# Patient Record
Sex: Male | Born: 1975 | Race: White | Hispanic: No | Marital: Married | Smoking: Never smoker
Health system: Southern US, Community
[De-identification: ages and names within clinical notes are randomized; demographics above are authoritative.]

## PROBLEM LIST (undated history)

## (undated) DIAGNOSIS — N189 Chronic kidney disease, unspecified: Secondary | ICD-10-CM

## (undated) DIAGNOSIS — S43006A Unspecified dislocation of unspecified shoulder joint, initial encounter: Secondary | ICD-10-CM

## (undated) HISTORY — DX: Chronic kidney disease, unspecified: N18.9

## (undated) HISTORY — PX: DENTAL SURGERY: SHX609

---

## 2011-01-09 ENCOUNTER — Ambulatory Visit: Payer: Self-pay | Admitting: Otolaryngology

## 2018-01-25 ENCOUNTER — Emergency Department
Admission: EM | Admit: 2018-01-25 | Discharge: 2018-01-25 | Disposition: A | Discharge status: Home / Self Care | Payer: Self-pay | Attending: Emergency Medicine | Admitting: Emergency Medicine

## 2018-01-25 ENCOUNTER — Emergency Department: Payer: Self-pay

## 2018-01-25 ENCOUNTER — Encounter: Payer: Self-pay | Admitting: Emergency Medicine

## 2018-01-25 DIAGNOSIS — R7401 Elevation of levels of liver transaminase levels: Secondary | ICD-10-CM

## 2018-01-25 DIAGNOSIS — N200 Calculus of kidney: Secondary | ICD-10-CM | POA: Insufficient documentation

## 2018-01-25 DIAGNOSIS — R740 Nonspecific elevation of levels of transaminase and lactic acid dehydrogenase [LDH]: Secondary | ICD-10-CM | POA: Insufficient documentation

## 2018-01-25 HISTORY — DX: Unspecified dislocation of unspecified shoulder joint, initial encounter: S43.006A

## 2018-01-25 LAB — COMPREHENSIVE METABOLIC PANEL
ALBUMIN: 4.6 g/dL (ref 3.5–5.0)
ALT: 45 U/L — AB (ref 0–44)
AST: 42 U/L — AB (ref 15–41)
Alkaline Phosphatase: 60 U/L (ref 38–126)
Anion gap: 12 (ref 5–15)
BUN: 19 mg/dL (ref 6–20)
CO2: 25 mmol/L (ref 22–32)
Calcium: 9.5 mg/dL (ref 8.9–10.3)
Chloride: 103 mmol/L (ref 98–111)
Creatinine, Ser: 1.31 mg/dL — ABNORMAL HIGH (ref 0.61–1.24)
GFR calc Af Amer: >60 mL/min (ref >60)
GFR calc non Af Amer: >60 mL/min (ref >60)
Glucose, Bld: 184 mg/dL — ABNORMAL HIGH (ref 70–99)
POTASSIUM: 3.6 mmol/L (ref 3.5–5.1)
Sodium: 140 mmol/L (ref 135–145)
TOTAL PROTEIN: 7.9 g/dL (ref 6.5–8.1)
Total Bilirubin: 0.9 mg/dL (ref 0.3–1.2)

## 2018-01-25 LAB — URINALYSIS, COMPLETE (UACMP) WITH MICROSCOPIC
Bacteria, UA: NONE SEEN
Bilirubin Urine: NEGATIVE
Glucose, UA: NEGATIVE mg/dL
KETONES UR: 5 mg/dL — AB
LEUKOCYTES UA: NEGATIVE
NITRITE: NEGATIVE
PH: 5 (ref 5.0–8.0)
PROTEIN: 100 mg/dL — AB
Specific Gravity, Urine: 1.035 — ABNORMAL HIGH (ref 1.005–1.030)
Squamous Epithelial / LPF: NONE SEEN (ref 0–5)

## 2018-01-25 LAB — CBC
HCT: 42.4 % (ref 40.0–52.0)
HEMOGLOBIN: 14.7 g/dL (ref 13.0–18.0)
MCH: 29.7 pg (ref 26.0–34.0)
MCHC: 34.7 g/dL (ref 32.0–36.0)
MCV: 85.7 fL (ref 80.0–100.0)
PLATELETS: 317 10*3/uL (ref 150–440)
RBC: 4.95 MIL/uL (ref 4.40–5.90)
RDW: 13.3 % (ref 11.5–14.5)
WBC: 9.8 10*3/uL (ref 3.8–10.6)

## 2018-01-25 LAB — LIPASE, BLOOD: LIPASE: 30 U/L (ref 11–51)

## 2018-01-25 MED ORDER — KETOROLAC TROMETHAMINE 30 MG/ML IJ SOLN
15.0000 mg | Freq: Once | INTRAMUSCULAR | Status: AC
  Administered 2018-01-25: 15 mg via INTRAVENOUS
  Filled 2018-01-25: qty 1

## 2018-01-25 MED ORDER — SODIUM CHLORIDE 0.9 % IV BOLUS
1000.0000 mL | Freq: Once | INTRAVENOUS | Status: AC
  Administered 2018-01-25: 1000 mL via INTRAVENOUS

## 2018-01-25 MED ORDER — OXYCODONE HCL 5 MG PO TABS
5.0000 mg | ORAL_TABLET | Freq: Once | ORAL | Status: AC
  Administered 2018-01-25: 5 mg via ORAL
  Filled 2018-01-25: qty 1

## 2018-01-25 MED ORDER — TAMSULOSIN HCL 0.4 MG PO CAPS
0.4000 mg | ORAL_CAPSULE | Freq: Once | ORAL | Status: AC
  Administered 2018-01-25: 0.4 mg via ORAL
  Filled 2018-01-25: qty 1

## 2018-01-25 MED ORDER — IBUPROFEN 600 MG PO TABS: 600.0000 mg | ORAL_TABLET | Freq: Four times a day (QID) | ORAL | 0 refills | Status: DC | PRN

## 2018-01-25 MED ORDER — OXYCODONE-ACETAMINOPHEN 5-325 MG PO TABS: 1.0000 | ORAL_TABLET | ORAL | 0 refills | Status: DC | PRN

## 2018-01-25 MED ORDER — ACETAMINOPHEN 500 MG PO TABS
1000.0000 mg | ORAL_TABLET | Freq: Once | ORAL | Status: AC
  Administered 2018-01-25: 1000 mg via ORAL
  Filled 2018-01-25: qty 2

## 2018-01-25 MED ORDER — MORPHINE SULFATE (PF) 4 MG/ML IV SOLN
4.0000 mg | Freq: Once | INTRAVENOUS | Status: AC
  Administered 2018-01-25: 4 mg via INTRAVENOUS
  Filled 2018-01-25: qty 1

## 2018-01-25 MED ORDER — ONDANSETRON HCL 4 MG/2ML IJ SOLN
4.0000 mg | Freq: Once | INTRAMUSCULAR | Status: AC | PRN
  Administered 2018-01-25: 4 mg via INTRAVENOUS
  Filled 2018-01-25: qty 2

## 2018-01-25 MED ORDER — ONDANSETRON 4 MG PO TBDP: 4.0000 mg | ORAL_TABLET | Freq: Three times a day (TID) | ORAL | 0 refills | Status: DC | PRN

## 2018-01-25 MED ORDER — ONDANSETRON HCL 4 MG/2ML IJ SOLN
4.0000 mg | Freq: Once | INTRAMUSCULAR | Status: AC
  Administered 2018-01-25: 4 mg via INTRAVENOUS
  Filled 2018-01-25: qty 2

## 2018-01-25 MED ORDER — TAMSULOSIN HCL 0.4 MG PO CAPS: 0.4000 mg | ORAL_CAPSULE | Freq: Every day | ORAL | 0 refills | Status: AC

## 2018-01-25 MED ORDER — FENTANYL CITRATE (PF) 100 MCG/2ML IJ SOLN
50.0000 ug | INTRAMUSCULAR | Status: DC | PRN
  Administered 2018-01-25: 50 ug via INTRAVENOUS
  Filled 2018-01-25: qty 2

## 2018-01-25 NOTE — ED Notes (Signed)

## 2018-01-25 NOTE — ED Provider Notes (Signed)
[facility] Emergency Department Provider Note  ____________________________________________  Time seen: Approximately 7:35 PM  I have reviewed the triage vital signs and the nursing notes.   HISTORY  Chief Complaint Abdominal Pain   HPI Todd DurandGrant Shea is a 42 y.o. male with no significant past medical history who presents for evaluation of abdominal pain.  Patient reports that he has had some mild abdominal pain for the last 3 days.  The pain is located in the right lower quadrant, initially dull and mild.  An hour ago the pain became severe and sharp.  He has had 2 or 3 episodes of nonbloody nonbilious emesis.  The pain radiates to his lower back in his groin.  No dysuria or hematuria, no fever but has had chills.  No constipation or diarrhea. No prior h/o kidney stone. Patient has had normal appetite.    Past Medical History:  Diagnosis Date  . Shoulder dislocation     There are no active problems to display for this patient.   Past Surgical History:  Procedure Laterality Date  . DENTAL SURGERY      Prior to Admission medications   Medication Sig Start Date End Date Taking? Authorizing Provider  ibuprofen (ADVIL,MOTRIN) 600 MG tablet Take 1 tablet (600 mg total) by mouth every 6 (six) hours as needed. 01/25/18   Nita SickleVeronese, [place], MD  ondansetron (ZOFRAN ODT) 4 MG disintegrating tablet Take 1 tablet (4 mg total) by mouth every 8 (eight) hours as needed for nausea or vomiting. 01/25/18   Don PerkingVeronese, [place], MD  oxyCODONE-acetaminophen (PERCOCET) 5-325 MG tablet Take 1 tablet by mouth every 4 (four) hours as needed for severe pain. 01/25/18   Nita SickleVeronese, [place], MD  tamsulosin (FLOMAX) 0.4 MG CAPS capsule Take 1 capsule (0.4 mg total) by mouth daily for 7 days. 01/25/18 02/01/18  Nita SickleVeronese, [place], MD    Allergies Patient has no known allergies.  No family history on file.  Social History Social History   Tobacco Use  . Smoking status: Never  Smoker  . Smokeless tobacco: Never Used  Substance Use Topics  . Alcohol use: Never    Frequency: Never  . Drug use: Not on file    Review of Systems  Constitutional: Negative for fever. Eyes: Negative for visual changes. ENT: Negative for sore throat. Neck: No neck pain  Cardiovascular: Negative for chest pain. Respiratory: Negative for shortness of breath. Gastrointestinal: + RLQ abdominal pain, nausea, and vomiting. No diarrhea. Genitourinary: Negative for dysuria. Musculoskeletal: Negative for back pain. Skin: Negative for rash. Neurological: Negative for headaches, weakness or numbness. Psych: No SI or HI  ____________________________________________   PHYSICAL EXAM:  VITAL SIGNS: ED Triage Vitals  Enc Vitals Group     BP 01/25/18 1854 116/63     Pulse Rate 01/25/18 1854 90     Resp 01/25/18 1854 20     Temp 01/25/18 1854 98.4 F (36.9 C)     Temp Source 01/25/18 1854 Oral     SpO2 01/25/18 1854 96 %     Weight 01/25/18 1852 165 lb (74.8 kg)     Height 01/25/18 1852 5\' 4"  (1.626 m)     Head Circumference --      Peak Flow --      Pain Score 01/25/18 1852 8     Pain Loc --      Pain Edu? --      Excl. in GC? --     Constitutional: Alert and oriented, actively vomiting.  HEENT:      Head: Normocephalic and atraumatic.         Eyes: Conjunctivae are normal. Sclera is non-icteric.       Mouth/Throat: Mucous membranes are moist.       Neck: Supple with no signs of meningismus. Cardiovascular: Regular rate and rhythm. No murmurs, gallops, or rubs. 2+ symmetrical distal pulses are present in all extremities. No JVD. Respiratory: Normal respiratory effort. Lungs are clear to auscultation bilaterally. No wheezes, crackles, or rhonchi.  Gastrointestinal: Soft, tender to palpation on the RLQ, and non distended with positive bowel sounds. No rebound or guarding. Genitourinary: No CVA tenderness. Bilateral testicles are descended with no tenderness to palpation,  bilateral positive cremasteric reflexes are present, no swelling or erythema of the scrotum. No evidence of inguinal hernia. Musculoskeletal: Nontender with normal range of motion in all extremities. No edema, cyanosis, or erythema of extremities. Neurologic: Normal speech and language. Face is symmetric. Moving all extremities. No gross focal neurologic deficits are appreciated. Skin: Skin is warm, dry and intact. No rash noted. Psychiatric: Mood and affect are normal. Speech and behavior are normal.  ____________________________________________   LABS (all labs ordered are listed, but only abnormal results are displayed)  Labs Reviewed  COMPREHENSIVE METABOLIC PANEL - Abnormal; Notable for the following components:      Result Value   Glucose, Bld 184 (*)    Creatinine, Ser 1.31 (*)    AST 42 (*)    ALT 45 (*)    All other components within normal limits  URINALYSIS, COMPLETE (UACMP) WITH MICROSCOPIC - Abnormal; Notable for the following components:   Color, Urine AMBER (*)    APPearance HAZY (*)    Specific Gravity, Urine 1.035 (*)    Hgb urine dipstick LARGE (*)    Ketones, ur 5 (*)    Protein, ur 100 (*)    All other components within normal limits  LIPASE, BLOOD  CBC   ____________________________________________  EKG  none  ____________________________________________  RADIOLOGY  I have personally reviewed the images performed during this visit and I agree with the Radiologist's read.   Interpretation by Radiologist:  Ct Renal Stone Study  Result Date: 01/25/2018 CLINICAL DATA:  Initial evaluation for acute right lower quadrant and flank pain. EXAM: CT ABDOMEN AND PELVIS WITHOUT CONTRAST TECHNIQUE: Multidetector CT imaging of the abdomen and pelvis was performed following the standard protocol without IV contrast. COMPARISON:  None. FINDINGS: Lower chest: Minimal atelectatic changes present within the visualized lung bases. Visualized lungs are otherwise clear.  Hepatobiliary: Liver demonstrates a normal unenhanced appearance. Gallbladder within normal limits. No biliary dilatation. Pancreas: Pancreas within normal limits. Spleen: Unremarkable. Adrenals/Urinary Tract: Adrenal glands are normal. 3 mm obstructive stone within the distal right ureter just proximal to the right UVJ. Secondary mild right hydroureteronephrosis. No other radiopaque calculi seen along the course of the right renal collecting system or within the right kidney. On the left, punctate 3 mm nonobstructive stone within the lower pole left kidney. No obstructive calculi seen along the course of the left renal collecting system. No left-sided hydronephrosis or hydroureter. Bladder largely decompressed without abnormality. No layering stones within the bladder lumen. Stomach/Bowel: Small hiatal hernia noted. Stomach otherwise unremarkable. No evidence for bowel obstruction. Normal appendix. No acute inflammatory changes about the bowels. Vascular/Lymphatic: Intra-abdominal aorta of normal caliber. No adenopathy. Reproductive: Normal prostate. Other: No free air or fluid. Musculoskeletal: No acute osseus abnormality. No worrisome lytic or blastic osseous lesions. IMPRESSION: 1. 3 mm obstructive  stone within the distal right ureter with secondary mild right hydroureteronephrosis. 2. 3 mm nonobstructive left renal calculus. 3. No other acute abnormality within the abdomen and pelvis. Electronically Signed   By: Rise Mu M.D.   On: 01/25/2018 19:45      ____________________________________________   PROCEDURES  Procedure(s) performed: None Procedures Critical Care performed:  None ____________________________________________   INITIAL IMPRESSION / ASSESSMENT AND PLAN / ED COURSE  42 y.o. male with no significant past medical history who presents for evaluation of abdominal pain, nausea, and vomiting.  Patient is actively vomiting and looks uncomfortable, his vitals are within normal  limits, he has tenderness to palpation the right lower quadrant, normal GU exam.  Differential diagnoses including but not limited to kidney stone versus appendicitis versus gallbladder disease. UA showing hematuria but no evidence of infection. CT renal pending. Morphine and zofran pending.    _________________________ 9:12 PM on 01/25/2018 -----------------------------------------  CT consistent with a 3 mm obstructive stone in the distal right ureter with mild hydroureteronephrosis.  Patient has normal kidney function.  UA negative for overlying infection.  Pain is well controlled on p.o. medications.  Labs were also concerning for mildly elevated liver enzymes.  Patient does not take Tylenol and does not drink alcohol.  Unclear etiology.  Recommended follow-up with primary care doctor in a few weeks for recheck.   As part of my medical decision making, I reviewed the following data within the electronic MEDICAL RECORD NUMBER Nursing notes reviewed and incorporated, Labs reviewed , Old chart reviewed, Radiograph reviewed , Notes from prior ED visits and [place] Controlled Substance Database    Pertinent labs & imaging results that were available during my care of the patient were reviewed by me and considered in my medical decision making (see chart for details).    ____________________________________________   FINAL CLINICAL IMPRESSION(S) / ED DIAGNOSES  Final diagnoses:  Kidney stone  Transaminitis      NEW MEDICATIONS STARTED DURING THIS VISIT:  ED Discharge Orders        Ordered    tamsulosin (FLOMAX) 0.4 MG CAPS capsule  Daily     01/25/18 2107    ibuprofen (ADVIL,MOTRIN) 600 MG tablet  Every 6 hours PRN     01/25/18 2107    oxyCODONE-acetaminophen (PERCOCET) 5-325 MG tablet  Every 4 hours PRN     01/25/18 2107    ondansetron (ZOFRAN ODT) 4 MG disintegrating tablet  Every 8 hours PRN     01/25/18 2107       Note:  This document was prepared using Dragon voice recognition  software and may include unintentional dictation errors.    Nita Sickle, MD 01/25/18 2114

## 2018-01-25 NOTE — ED Triage Notes (Signed)
Patient reports severe right lower quadrant abdominal pain that began approx. 1 hour ago.  Patient appears pale and diaphoretic.  Patient reports tenderness to area.  Patient appears very uncomfortable.

## 2018-01-25 NOTE — Discharge Instructions (Addendum)
You have been seen in the Emergency Department (ED)  Today and was diagnosed with kidney stones. While the stone is traveling through the ureter, which is the tube that carries urine from the kidney to the bladder, you will probably feel pain. The pain may be mild or very severe. You may also have some blood in your urine. As soon as the stone reaches the bladder, any intense pain should go away. If a stone is too large to pass on its own, you may need a medical procedure to help you pass the stone.   As we have discussed, please drink plenty of fluids and use a urinary strainer to attempt to capture the stone.  Please make a follow up appointment with Urology in the next week by calling the number below and bring the stone with you.  Take ibuprofen 600mg  every 6 hours for the pain. If the pain is not well controlled with ibuprofen you may take one percocet every 4 hours. Do not take tylenol while taking percocet. Please also take your prescribed flomax daily. Check with your doctor if you have a history of gastritis, stomach ulcers, renal failure or impaired kidney function as you may not be able to take ibuprofen/ motrin. Your doctor can give you a different prescription for pain control.  Follow-up with your doctor or return to the ER in 12-24 hours if your pain is not well controlled, if you develop pain or burning with urination, or if you develop a fever. Otherwise follow up in 3-5 days with your doctor.  Also follow up with your doctor or [facility] in a few weeks to have your liver enzymes re-cehceked as they were slightly elevated today.  When should you call for help?  Call your doctor now or seek immediate medical care if:  You cannot keep down fluids.  Your pain gets worse.  You have a fever or chills.  You have new or worse pain in your back just below your rib cage (the flank area).  You have new or more blood in your urine. You have pain or burning with urination You are unable  to urinate You have abdominal pain  Watch closely for changes in your health, and be sure to contact your doctor if:  You do not get better as expected  How can you care for yourself at home?  Drink plenty of fluids, enough so that your urine is light yellow or clear like water. If you have kidney, heart, or liver disease and have to limit fluids, talk with your doctor before you increase the amount of fluids you drink.  Take pain medicines exactly as directed. Call your doctor if you think you are having a problem with your medicine.  If the doctor gave you a prescription medicine for pain, take it as prescribed.  If you are not taking a prescription pain medicine, ask your doctor if you can take an over-the-counter medicine. Read and follow all instructions on the label. Your doctor may ask you to strain your urine so that you can collect your kidney stone when it passes. You can use a kitchen strainer or a tea strainer to catch the stone. Store it in a plastic bag until you see your doctor again.  Preventing future kidney stones  Some changes in your diet may help prevent kidney stones. Depending on the cause of your stones, your doctor may recommend that you:  Drink plenty of fluids, enough so that your urine is  light yellow or clear like water. If you have kidney, heart, or liver disease and have to limit fluids, talk with your doctor before you increase the amount of fluids you drink.  Limit coffee, tea, and alcohol. Also avoid grapefruit juice.  Do not take more than the recommended daily dose of vitamins C and D.  Avoid antacids such as Gaviscon, Maalox, Mylanta, or Tums.  Limit the amount of salt (sodium) in your diet.  Eat a balanced diet that is not too high in protein.  Limit foods that are high in a substance called oxalate, which can cause kidney stones. These foods include dark green vegetables, rhubarb, chocolate, wheat bran, nuts, cranberries, and beans.

## 2018-02-03 ENCOUNTER — Ambulatory Visit (INDEPENDENT_AMBULATORY_CARE_PROVIDER_SITE_OTHER): Payer: Self-pay | Admitting: Urology

## 2018-02-03 ENCOUNTER — Encounter: Payer: Self-pay | Admitting: Urology

## 2018-02-03 VITALS — BP 123/83 | HR 80 | Ht 66.0 in | Wt 160.0 lb

## 2018-02-03 DIAGNOSIS — R945 Abnormal results of liver function studies: Secondary | ICD-10-CM

## 2018-02-03 DIAGNOSIS — N200 Calculus of kidney: Secondary | ICD-10-CM

## 2018-02-03 DIAGNOSIS — R7989 Other specified abnormal findings of blood chemistry: Secondary | ICD-10-CM

## 2018-02-03 LAB — MICROSCOPIC EXAMINATION: Epithelial Cells (non renal): NONE SEEN /hpf (ref 0–10)

## 2018-02-03 LAB — URINALYSIS, COMPLETE
Bilirubin, UA: NEGATIVE
Glucose, UA: NEGATIVE
Ketones, UA: NEGATIVE
Nitrite, UA: NEGATIVE
Specific Gravity, UA: 1.02 (ref 1.005–1.030)
Urobilinogen, Ur: 0.2 mg/dL (ref 0.2–1.0)
pH, UA: 7 (ref 5.0–7.5)

## 2018-02-03 NOTE — Progress Notes (Signed)
02/03/2018 5:35 PM   Todd DurandGrant Shea 1975/09/09 [id number]  Referring provider: No referring provider defined for this encounter.  Chief Complaint  Patient presents with  . Nephrolithiasis    New Patient    HPI: 42 year old male who presents today for further evaluation  He was seen and evaluated in the emergency room on 01/25/2018 with a 3-day history of right lower quadrant pain.  He initially thought it was something that he ate but then it became more severe sharp.  He did have associated nausea and vomiting.  No gross hematuria.  He ultimately underwent noncontrast CT scan which showed a 3 mm right distal ureteral calculus with mild right hydronephrosis.  He also has a punctate left renal calculus.  He reports that he has extremely dull right lower quadrant pain but significantly improved over the past several days.  He did pass some substance which he brings with him today.  On further inspection, this appears to be an old blood clot with calcification in the middle consistent with the size of the stone on CT scan.  He denies any urinary symptoms including gross hematuria or dysuria.  He did pass a small clot yesterday but otherwise has not been having gross blood.  No fevers or chills.  He denies a personal history of kidney stones.  He does try to drink a good amount of water but admits to being dehydrated at times.  He does also report that in the emergency room, his LFTs were mildly elevated.  He does not have a primary care physician.  He denies using alcohol or Tylenol prior to his emergency room visit.  No right upper quadrant pain.   PMH: Past Medical History:  Diagnosis Date  . Shoulder dislocation     Surgical History: Past Surgical History:  Procedure Laterality Date  . DENTAL SURGERY      Home Medications:  Allergies as of 02/03/2018   No Known Allergies     Medication List        Accurate as of 02/03/18  5:35 PM. Always use your most recent med list.            ibuprofen 600 MG tablet Commonly known as:  ADVIL,MOTRIN Take 1 tablet (600 mg total) by mouth every 6 (six) hours as needed.   ondansetron 4 MG disintegrating tablet Commonly known as:  ZOFRAN ODT Take 1 tablet (4 mg total) by mouth every 8 (eight) hours as needed for nausea or vomiting.   oxyCODONE-acetaminophen 5-325 MG tablet Commonly known as:  PERCOCET Take 1 tablet by mouth every 4 (four) hours as needed for severe pain.   tamsulosin 0.4 MG Caps capsule Commonly known as:  FLOMAX Take 0.4 mg by mouth.       Allergies: No Known Allergies  Family History: History reviewed. No pertinent family history.  Social History:  reports that he has never smoked. He has never used smokeless tobacco. He reports that he does not drink alcohol. His drug history is not on file.  ROS: UROLOGY Frequent Urination?: Yes Hard to postpone urination?: No Burning/pain with urination?: No Get up at night to urinate?: Yes Leakage of urine?: No Urine stream starts and stops?: No Trouble starting stream?: No Do you have to strain to urinate?: No Blood in urine?: No Urinary tract infection?: No Sexually transmitted disease?: No Injury to kidneys or bladder?: No Painful intercourse?: No Weak stream?: No Erection problems?: No Penile pain?: No  Gastrointestinal Nausea?: Yes Vomiting?: Yes Indigestion/heartburn?: No  Diarrhea?: No Constipation?: No  Constitutional Fever: Yes Night sweats?: No Weight loss?: No Fatigue?: Yes  Skin Skin rash/lesions?: Yes Itching?: Yes  Eyes Blurred vision?: No Double vision?: No  Ears/Nose/Throat Sore throat?: No Sinus problems?: No  Hematologic/Lymphatic Swollen glands?: No Easy bruising?: No  Cardiovascular Leg swelling?: No Chest pain?: No  Respiratory Cough?: No Shortness of breath?: No  Endocrine Excessive thirst?: No  Musculoskeletal Back pain?: Yes Joint pain?: Yes  Neurological Headaches?:  Yes Dizziness?: No  Psychologic Depression?: No Anxiety?: No  Physical Exam: BP 123/83   Pulse 80   Ht 5\' 6"  (1.676 m)   Wt 160 lb (72.6 kg)   BMI 25.82 kg/m   Constitutional:  Alert and oriented, No acute distress. HEENT: [place] AT, moist mucus membranes.  Trachea midline, no masses. Cardiovascular: No clubbing, cyanosis, or edema. Respiratory: Normal respiratory effort, no increased work of breathing. GI: Abdomen is soft, nontender, nondistended, no abdominal masses GU: No CVA tenderness Skin: No rashes, bruises or suspicious lesions. Neurologic: Grossly intact, no focal deficits, moving all 4 extremities. Psychiatric: Normal mood and affect.  Laboratory Data: Lab Results  Component Value Date   WBC 9.8 01/25/2018   HGB 14.7 01/25/2018   HCT 42.4 01/25/2018   MCV 85.7 01/25/2018   PLT 317 01/25/2018    Lab Results  Component Value Date   CREATININE 1.31 (H) 01/25/2018    Urinalysis UA today with 11-30 white blood cells, 11-30 red blood cells, nitrate negative with moderate bacteria.  See epic for details.  Pertinent Imaging: Results for orders placed during the hospital encounter of 01/25/18  CT Renal Stone Study   Narrative CLINICAL DATA:  Initial evaluation for acute right lower quadrant and flank pain.  EXAM: CT ABDOMEN AND PELVIS WITHOUT CONTRAST  TECHNIQUE: Multidetector CT imaging of the abdomen and pelvis was performed following the standard protocol without IV contrast.  COMPARISON:  None.  FINDINGS: Lower chest: Minimal atelectatic changes present within the visualized lung bases. Visualized lungs are otherwise clear.  Hepatobiliary: Liver demonstrates a normal unenhanced appearance. Gallbladder within normal limits. No biliary dilatation.  Pancreas: Pancreas within normal limits.  Spleen: Unremarkable.  Adrenals/Urinary Tract: Adrenal glands are normal.  3 mm obstructive stone within the distal right ureter just proximal to the right UVJ.  Secondary mild right hydroureteronephrosis. No other radiopaque calculi seen along the course of the right renal collecting system or within the right kidney.  On the left, punctate 3 mm nonobstructive stone within the lower pole left kidney. No obstructive calculi seen along the course of the left renal collecting system. No left-sided hydronephrosis or hydroureter.  Bladder largely decompressed without abnormality. No layering stones within the bladder lumen.  Stomach/Bowel: Small hiatal hernia noted. Stomach otherwise unremarkable. No evidence for bowel obstruction. Normal appendix. No acute inflammatory changes about the bowels.  Vascular/Lymphatic: Intra-abdominal aorta of normal caliber. No adenopathy.  Reproductive: Normal prostate.  Other: No free air or fluid.  Musculoskeletal: No acute osseus abnormality. No worrisome lytic or blastic osseous lesions.  IMPRESSION: 1. 3 mm obstructive stone within the distal right ureter with secondary mild right hydroureteronephrosis. 2. 3 mm nonobstructive left renal calculus. 3. No other acute abnormality within the abdomen and pelvis.   Electronically Signed   By: Rise Mu M.D.   On: 01/25/2018 19:45     Assessment & Plan:    1. Nephrolithiasis Status post interval passage of right 3 mm calculus  Offered to send stone for analysis today but declined due  to concern for cost  He does have a punctate stone on the left side but given the location and actually small size, no need for further surveillance unless he becomes symptomatic  We discussed general stone prevention techniques including drinking plenty water with goal of producing 2.5 L urine daily, increased citric acid intake, avoidance of high oxalate containing foods, and decreased salt intake.  Information about dietary recommendations given today.   He was  advised to call if his abdominal right lower quadrant pain fails to improve in the next few  days  Continue Flomax for few more days as precaution, may stop all other meds  - Urinalysis, Complete - CULTURE, URINE COMPREHENSIVE  2. Elevated LFTs Etiology of elevated LFTs unclear Patient does not have primary care physician and wonders if this is somehow related to his stone episode We will go ahead and repeat his LFTs today and he will establish care with a primary care physician if they remain elevated for further evaluation - Hepatic function panel  Follow-up as needed  Vanna Scotland, MD  [facility] [address], [address] [place], [place] [address] [phone]

## 2018-02-04 LAB — HEPATIC FUNCTION PANEL
ALT: 33 IU/L (ref 0–44)
AST: 16 IU/L (ref 0–40)
Albumin: 4.3 g/dL (ref 3.5–5.5)
Alkaline Phosphatase: 115 IU/L (ref 39–117)
BILIRUBIN TOTAL: 0.6 mg/dL (ref 0.0–1.2)
BILIRUBIN, DIRECT: 0.16 mg/dL (ref 0.00–0.40)
TOTAL PROTEIN: 7.4 g/dL (ref 6.0–8.5)

## 2018-02-05 LAB — CULTURE, URINE COMPREHENSIVE

## 2018-02-15 ENCOUNTER — Ambulatory Visit: Payer: Self-pay | Admitting: Family Medicine

## 2018-02-15 ENCOUNTER — Encounter: Payer: Self-pay | Admitting: Family Medicine

## 2018-02-15 ENCOUNTER — Other Ambulatory Visit: Payer: Self-pay

## 2018-02-15 VITALS — BP 110/75 | HR 77 | Temp 98.0°F | Ht 66.0 in | Wt 157.3 lb

## 2018-02-15 DIAGNOSIS — Z7689 Persons encountering health services in other specified circumstances: Secondary | ICD-10-CM

## 2018-02-15 DIAGNOSIS — L502 Urticaria due to cold and heat: Secondary | ICD-10-CM

## 2018-02-15 MED ORDER — CETIRIZINE HCL 10 MG PO TABS: 10.0000 mg | ORAL_TABLET | Freq: Two times a day (BID) | ORAL | 0 refills | Status: AC

## 2018-02-15 MED ORDER — PREDNISONE 10 MG PO TABS: ORAL_TABLET | ORAL | 0 refills | Status: AC

## 2018-02-15 NOTE — Progress Notes (Signed)
BP 110/75   Pulse 77   Temp 98 F (36.7 C) (Oral)   Ht 5\' 6"  (1.676 m)   Wt 157 lb 4.8 oz (71.4 kg)   SpO2 98%   BMI 25.39 kg/m    Subjective:    Patient ID: Todd Shea, male    DOB: 03/02/1976, 42 y.o.   MRN: [id number]  HPI: Todd Shea is a 42 y.o. male  Chief Complaint  Patient presents with  . New Patient (Initial Visit)    pt would like to talk about his kidneys, and rashes on any where in his body when it is exposed to cold   Recently hospitalized for kidney stones, completely resolved with no lingering sxs.   Main concern today is hives that started in July. Seems to be exacerbated by direct cold (cold water, air conditioner blowing on him). Not using anything OTC for it. Areas usually resolve on their own but continue to come back with exposure.   Has not had a CPE for quite some time. Currently without health insurance. No known chronic medical problems, not taking any medications.   Relevant past medical, surgical, family and social history reviewed and updated as indicated. Interim medical history since our last visit reviewed. Allergies and medications reviewed and updated.  Review of Systems  Per HPI unless specifically indicated above     Objective:    BP 110/75   Pulse 77   Temp 98 F (36.7 C) (Oral)   Ht 5\' 6"  (1.676 m)   Wt 157 lb 4.8 oz (71.4 kg)   SpO2 98%   BMI 25.39 kg/m   Wt Readings from Last 3 Encounters:  02/15/18 157 lb 4.8 oz (71.4 kg)  02/03/18 160 lb (72.6 kg)  01/25/18 165 lb (74.8 kg)    Physical Exam  Constitutional: He is oriented to person, place, and time. He appears well-developed and well-nourished. No distress.  HENT:  Head: Atraumatic.  Eyes: Conjunctivae and EOM are normal.  Neck: Normal range of motion. Neck supple.  Cardiovascular: Normal rate and regular rhythm.  Pulmonary/Chest: Effort normal and breath sounds normal.  Musculoskeletal: Normal range of motion.  Neurological: He is alert and oriented to  person, place, and time.  Skin: Skin is warm and dry.  Erythematous maculopapular rash on forearm  Psychiatric: He has a normal mood and affect. His behavior is normal.  Nursing note and vitals reviewed.   Results for orders placed or performed in visit on 02/03/18  CULTURE, URINE COMPREHENSIVE  Result Value Ref Range   Urine Culture, Comprehensive Final report    Organism ID, Bacteria Comment   Microscopic Examination  Result Value Ref Range   WBC, UA 11-30 (A) 0 - 5 /hpf   RBC, UA 11-30 (A) 0 - 2 /hpf   Epithelial Cells (non renal) None seen 0 - 10 /hpf   Casts Present (A) None seen /lpf   Cast Type Hyaline casts N/A   Mucus, UA Present (A) Not Estab.   Bacteria, UA Moderate (A) None seen/Few  Urinalysis, Complete  Result Value Ref Range   Specific Gravity, UA 1.020 1.005 - 1.030   pH, UA 7.0 5.0 - 7.5   Color, UA Yellow Yellow   Appearance Ur Cloudy (A) Clear   Leukocytes, UA 2+ (A) Negative   Protein, UA 3+ (A) Negative/Trace   Glucose, UA Negative Negative   Ketones, UA Negative Negative   RBC, UA 2+ (A) Negative   Bilirubin, UA Negative Negative  Urobilinogen, Ur 0.2 0.2 - 1.0 mg/dL   Nitrite, UA Negative Negative   Microscopic Examination See below:   Hepatic function panel  Result Value Ref Range   Total Protein 7.4 6.0 - 8.5 g/dL   Albumin 4.3 3.5 - 5.5 g/dL   Bilirubin Total 0.6 0.0 - 1.2 mg/dL   Bilirubin, Direct [id number] 0.00 - 0.40 mg/dL   Alkaline Phosphatase 115 39 - 117 IU/L   AST 16 0 - 40 IU/L   ALT 33 0 - 44 IU/L      Assessment & Plan:   Problem List Items Addressed This Visit    None    Visit Diagnoses    Urticaria due to cold    -  Primary   Will try zyrtec BID and a short course of prednisone. F/u if not improving. Cortisone cream prn for spot treatment   Encounter to establish care           Follow up plan: Return for CPE.

## 2018-02-18 NOTE — Patient Instructions (Signed)
Follow up for CPE 

## 2018-09-04 ENCOUNTER — Telehealth: Payer: Self-pay | Admitting: Physician Assistant

## 2018-09-04 DIAGNOSIS — B9689 Other specified bacterial agents as the cause of diseases classified elsewhere: Secondary | ICD-10-CM

## 2018-09-04 DIAGNOSIS — J069 Acute upper respiratory infection, unspecified: Secondary | ICD-10-CM

## 2018-09-04 MED ORDER — AZITHROMYCIN 250 MG PO TABS: ORAL_TABLET | ORAL | 0 refills | Status: AC

## 2018-09-04 NOTE — Progress Notes (Signed)

## 2018-09-04 NOTE — Progress Notes (Signed)
There are other unrelated non-urgent complaints, but due to the busy schedule and the amount of time I've already spent with him, time does not permit me to address these routine issues at today's visit. I've requested another appointment to review these additional issues.

## 2019-04-25 IMAGING — CT CT RENAL STONE PROTOCOL
3 of 4 series · 8 of 46 positions shown, 15 images · non-contrast
Comparison: None.

CLINICAL DATA: Initial evaluation for acute right lower quadrant
and flank pain.

EXAM:
CT ABDOMEN AND PELVIS WITHOUT CONTRAST
TECHNIQUE: Multidetector CT imaging of the abdomen and pelvis was performed
following the standard protocol without IV contrast.

[Series 4: lung bases · axial · 0.64mm/px · z∈[-832,-772]mm · 4 of 21 slices shown, 9 images]
[im 5/21  soft-tissue]
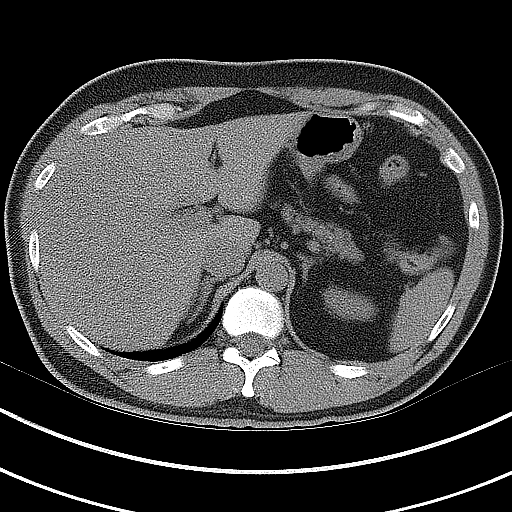
[im 5/21  lung]
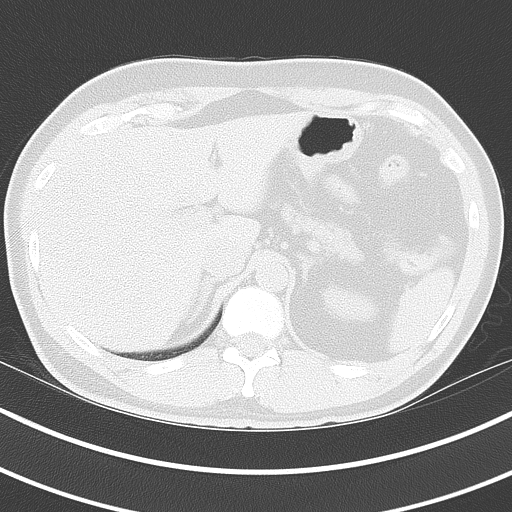
[im 5/21  bone]
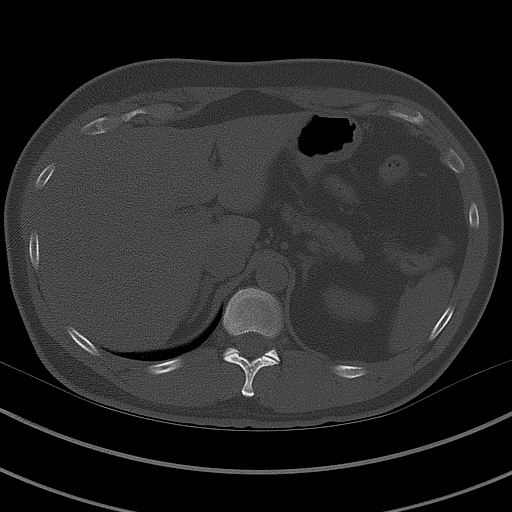
[im 9/21  soft-tissue]
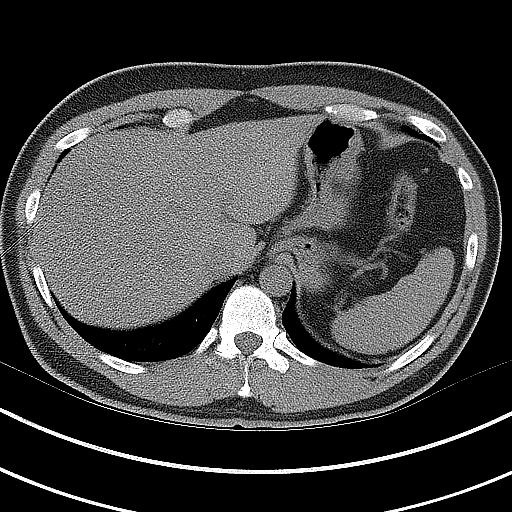
[im 9/21  lung]
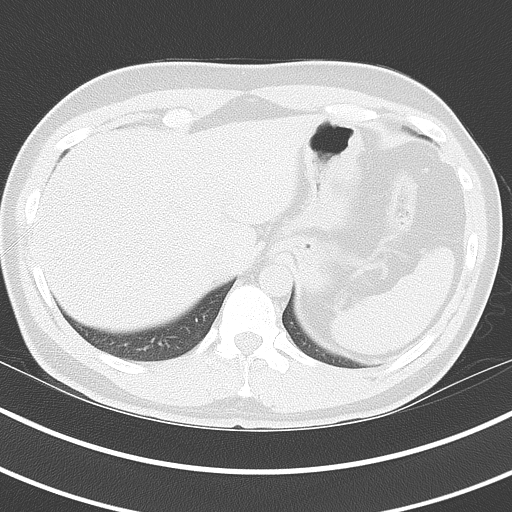
[im 13/21  soft-tissue]
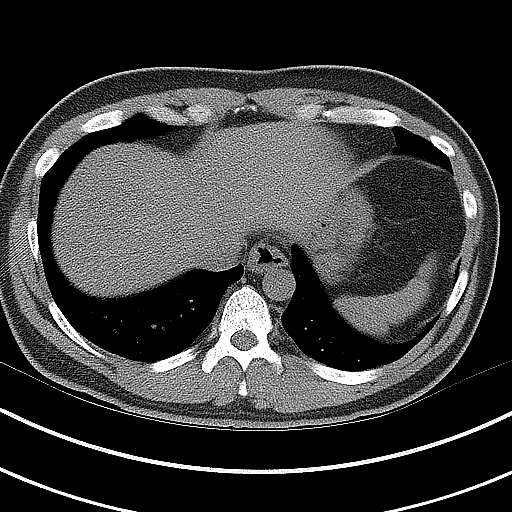
[im 13/21  lung]
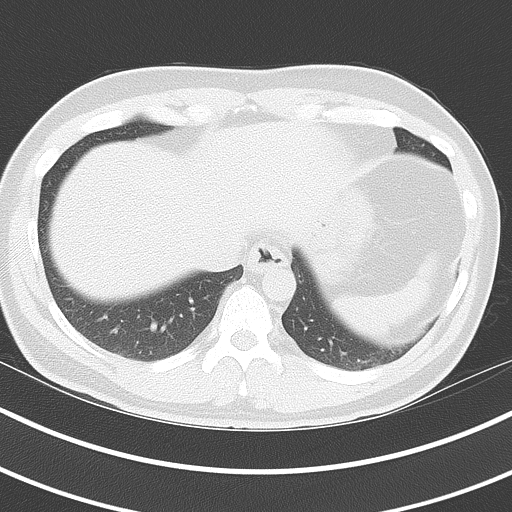
[im 17/21  soft-tissue]
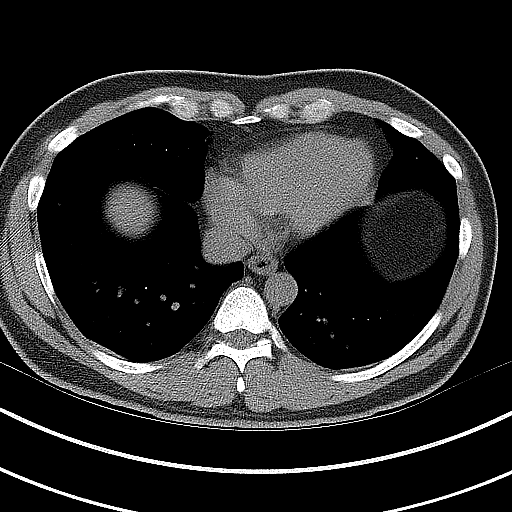
[im 17/21  lung]
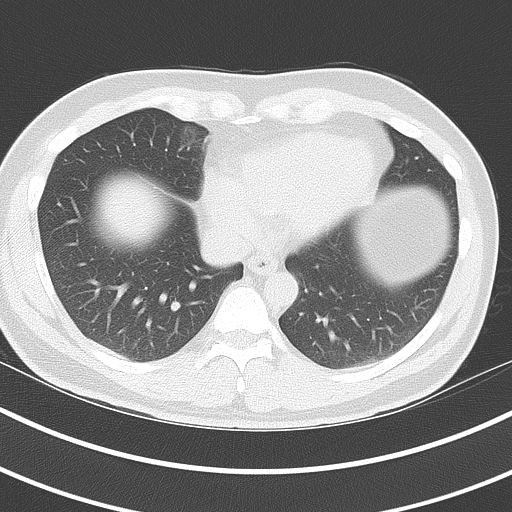

[Series 5: coronal · coronal · 0.69mm/px · 3 of 99 slices shown, 4 images]
[im 33/99  soft-tissue]
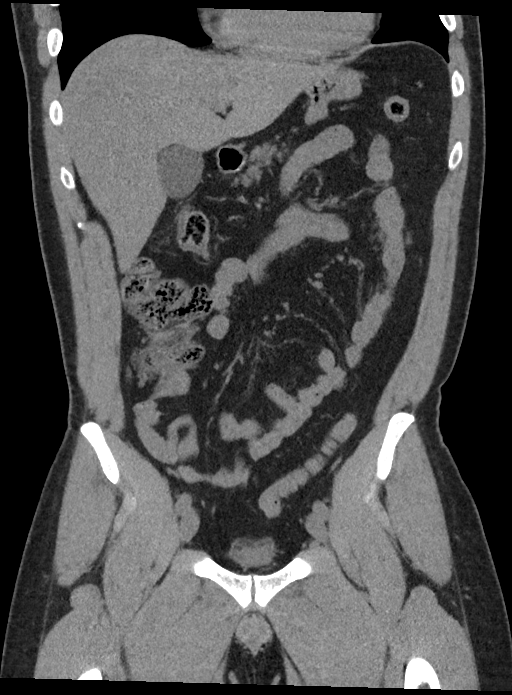
[im 44/99  soft-tissue]
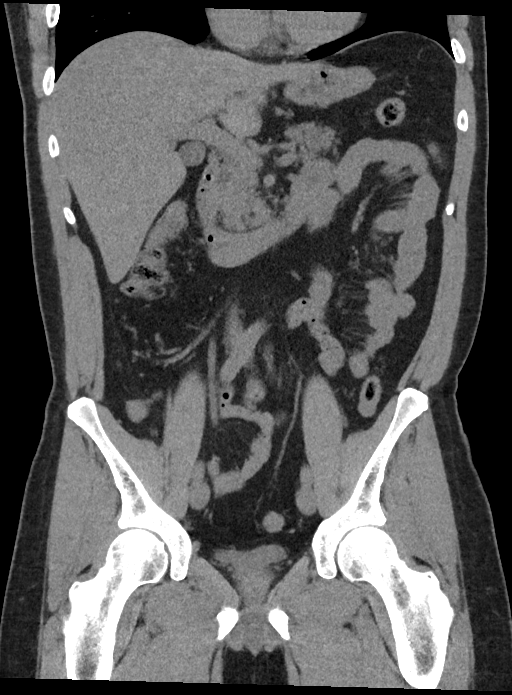
[im 44/99  bone]
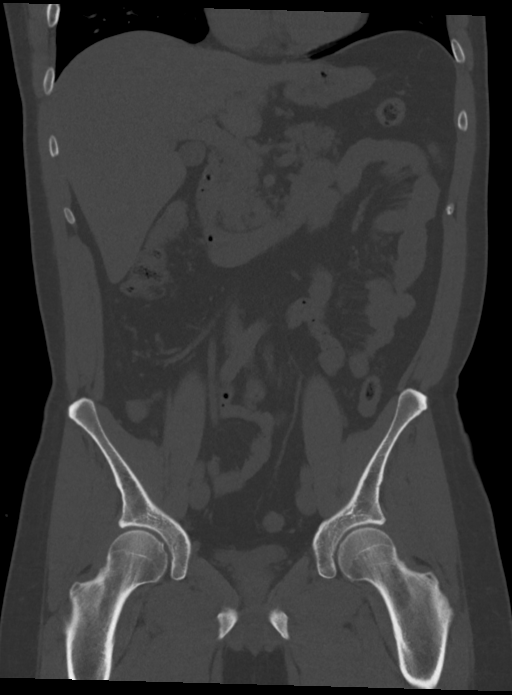
[im 55/99  soft-tissue]
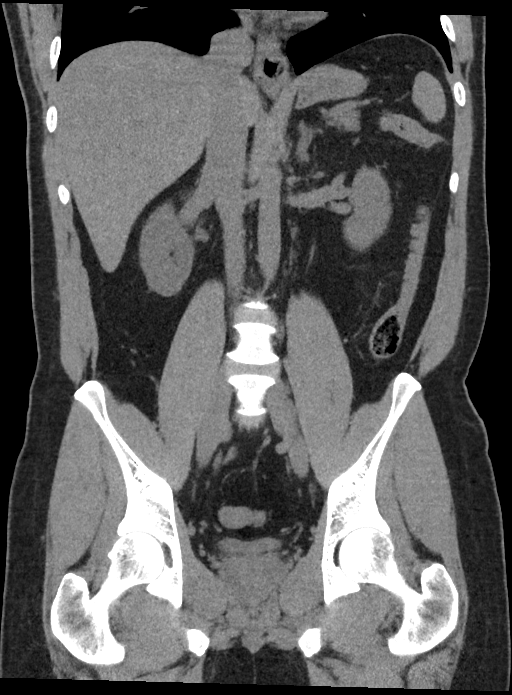

[Series 6: sagittal · sagittal · 0.53mm/px · 1 of 154 slices shown, 2 images]
[im 52/154  soft-tissue]
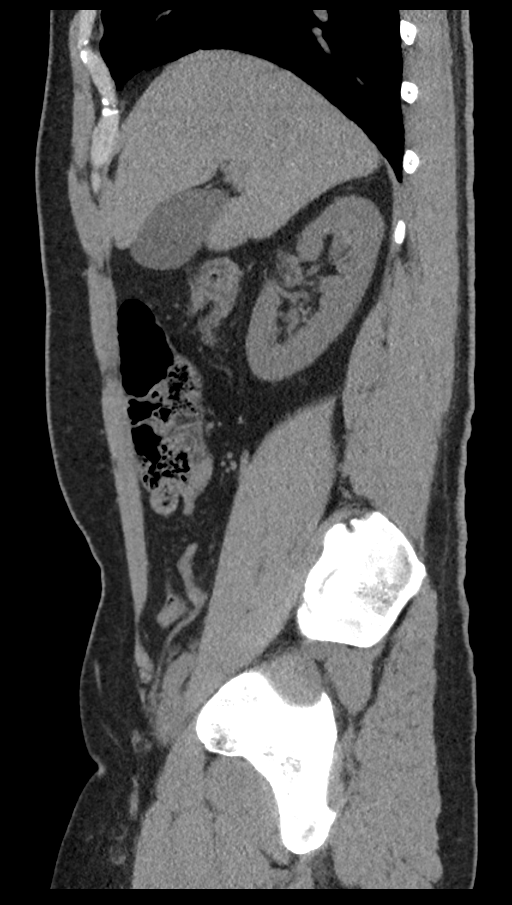
[im 52/154  bone]
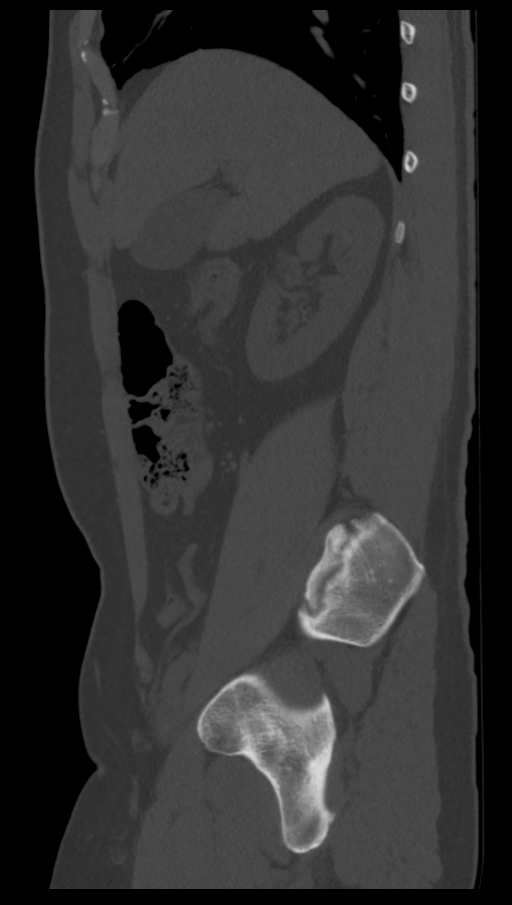

[8 of 46 positions shown; findings below may reference images not displayed]

FINDINGS: Lower chest: Minimal atelectatic changes present within the
visualized lung bases. Visualized lungs are otherwise clear.

Hepatobiliary: Liver demonstrates a normal unenhanced appearance.
Gallbladder within normal limits. No biliary dilatation.

Pancreas: Pancreas within normal limits.

Spleen: Unremarkable.

Adrenals/Urinary Tract: Adrenal glands are normal.

3 mm obstructive stone within the distal right ureter just proximal
to the right UVJ. Secondary mild right hydroureteronephrosis. No
other radiopaque calculi seen along the course of the right renal
collecting system or within the right kidney.

On the left, punctate 3 mm nonobstructive stone within the lower
pole left kidney. No obstructive calculi seen along the course of
the left renal collecting system. No left-sided hydronephrosis or
hydroureter.

Bladder largely decompressed without abnormality. No layering stones
within the bladder lumen.

Stomach/Bowel: Small hiatal hernia noted. Stomach otherwise
unremarkable. No evidence for bowel obstruction. Normal appendix. No
acute inflammatory changes about the bowels.

Vascular/Lymphatic: Intra-abdominal aorta of normal caliber. No
adenopathy.

Reproductive: Normal prostate.

Other: No free air or fluid.

Musculoskeletal: No acute osseus abnormality. No worrisome lytic or
blastic osseous lesions.
IMPRESSION: 1. 3 mm obstructive stone within the distal right ureter with
secondary mild right hydroureteronephrosis.
2. 3 mm nonobstructive left renal calculus.
3. No other acute abnormality within the abdomen and pelvis.

## 2019-05-03 NOTE — Progress Notes (Signed)
I have spent 5 minutes in review of e-visit questionnaire, review and updating patient chart, medical decision making and response to patient.   Wavie Hashimi Cody Kentrel Clevenger, PA-C
